# Patient Record
Sex: Female | Born: 1945 | Race: White | Hispanic: No | Marital: Married
Health system: Southern US, Community
[De-identification: ages and names within clinical notes are randomized; demographics above are authoritative.]

---

## 2003-11-01 ENCOUNTER — Other Ambulatory Visit: Payer: Self-pay

## 2005-06-08 ENCOUNTER — Emergency Department: Payer: Self-pay | Admitting: Emergency Medicine

## 2005-09-27 ENCOUNTER — Emergency Department: Payer: Self-pay | Admitting: Emergency Medicine

## 2006-07-02 ENCOUNTER — Other Ambulatory Visit: Payer: Self-pay

## 2006-07-02 ENCOUNTER — Emergency Department: Payer: Self-pay | Admitting: Emergency Medicine

## 2006-09-13 ENCOUNTER — Ambulatory Visit: Payer: Self-pay | Admitting: Gastroenterology

## 2007-02-01 ENCOUNTER — Ambulatory Visit: Payer: Self-pay | Admitting: Family Medicine

## 2007-02-10 ENCOUNTER — Inpatient Hospital Stay: Payer: Self-pay

## 2007-06-01 ENCOUNTER — Emergency Department: Payer: Self-pay | Admitting: Emergency Medicine

## 2007-06-03 ENCOUNTER — Emergency Department: Payer: Self-pay | Admitting: Emergency Medicine

## 2009-05-02 ENCOUNTER — Ambulatory Visit: Payer: Self-pay | Admitting: Family Medicine

## 2009-07-11 ENCOUNTER — Emergency Department: Payer: Self-pay | Admitting: Emergency Medicine

## 2009-08-24 ENCOUNTER — Emergency Department: Payer: Self-pay | Admitting: Emergency Medicine

## 2010-03-22 ENCOUNTER — Ambulatory Visit: Payer: Self-pay | Admitting: Internal Medicine

## 2011-01-06 ENCOUNTER — Ambulatory Visit: Payer: Self-pay | Admitting: Internal Medicine

## 2011-01-08 ENCOUNTER — Ambulatory Visit: Payer: Self-pay | Admitting: Internal Medicine

## 2011-06-11 ENCOUNTER — Emergency Department: Payer: Self-pay | Admitting: Emergency Medicine

## 2011-06-15 ENCOUNTER — Ambulatory Visit: Payer: Self-pay | Admitting: Specialist

## 2011-07-20 ENCOUNTER — Ambulatory Visit: Payer: Self-pay | Admitting: Internal Medicine

## 2011-12-31 ENCOUNTER — Ambulatory Visit: Payer: Self-pay | Admitting: Urology

## 2012-09-04 ENCOUNTER — Ambulatory Visit: Payer: Self-pay | Admitting: Orthopedic Surgery

## 2012-09-04 ENCOUNTER — Emergency Department: Payer: Self-pay | Admitting: Internal Medicine

## 2012-09-04 LAB — CBC
HCT: 36.2 % (ref 35.0–47.0)
HGB: 12.5 g/dL (ref 12.0–16.0)
MCH: 30.7 pg (ref 26.0–34.0)
MCHC: 34.5 g/dL (ref 32.0–36.0)
MCV: 89 fL (ref 80–100)
Platelet: 217 10*3/uL (ref 150–440)
RBC: 4.06 10*6/uL (ref 3.80–5.20)
RDW: 13.4 % (ref 11.5–14.5)
WBC: 4.4 10*3/uL (ref 3.6–11.0)

## 2012-09-04 LAB — BASIC METABOLIC PANEL
BUN: 12 mg/dL (ref 7–18)
Calcium, Total: 8.8 mg/dL (ref 8.5–10.1)
EGFR (African American): >60
EGFR (Non-African Amer.): >60
Glucose: 127 mg/dL — ABNORMAL HIGH (ref 65–99)
Osmolality: 283 (ref 275–301)
Sodium: 141 mmol/L (ref 136–145)

## 2012-09-04 LAB — TROPONIN I: Troponin-I: <0.02 ng/mL

## 2012-11-07 ENCOUNTER — Ambulatory Visit: Payer: Self-pay

## 2013-02-07 ENCOUNTER — Ambulatory Visit: Payer: Self-pay | Admitting: Internal Medicine

## 2013-04-08 IMAGING — CR DG HAND COMPLETE 3+V*L*
1 series · 3 of 3 positions shown · non-contrast
Comparison: none

REASON FOR EXAM: hand pain/fall
COMMENTS:

PROCEDURE:     DXR - DXR HAND LT COMPLETE  W/OBLIQUES  - September 04, 2012  [DATE]
RESULT:     Comparison:  None

[Series 1: x hand pa left · 0.14mm/px · 3 of 3 slices shown]
[im 1/3]
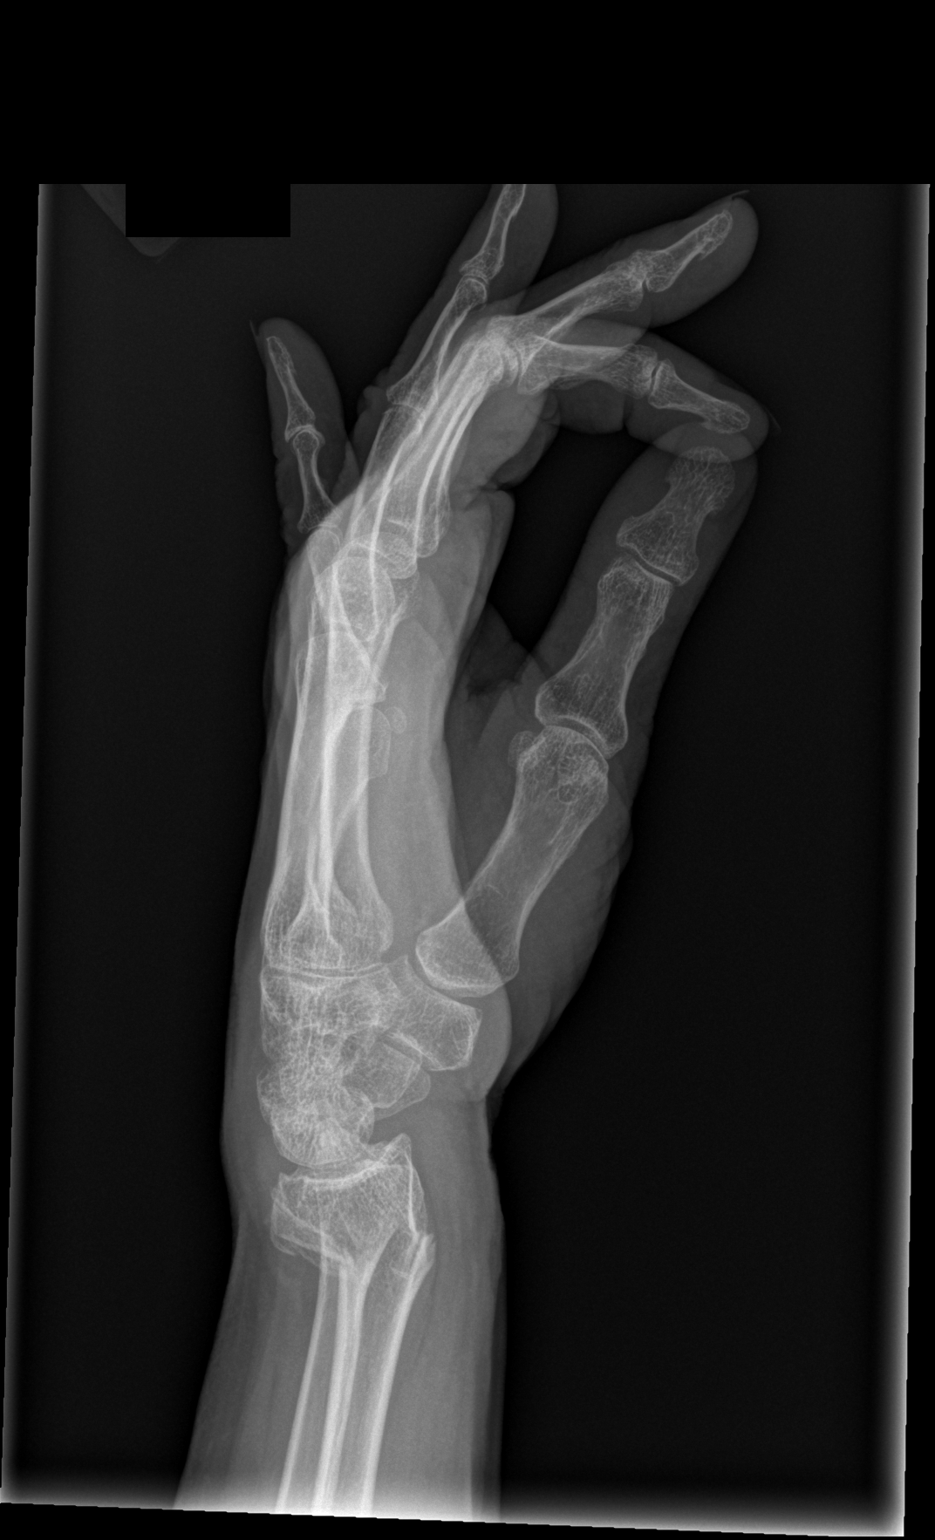
[im 2/3]
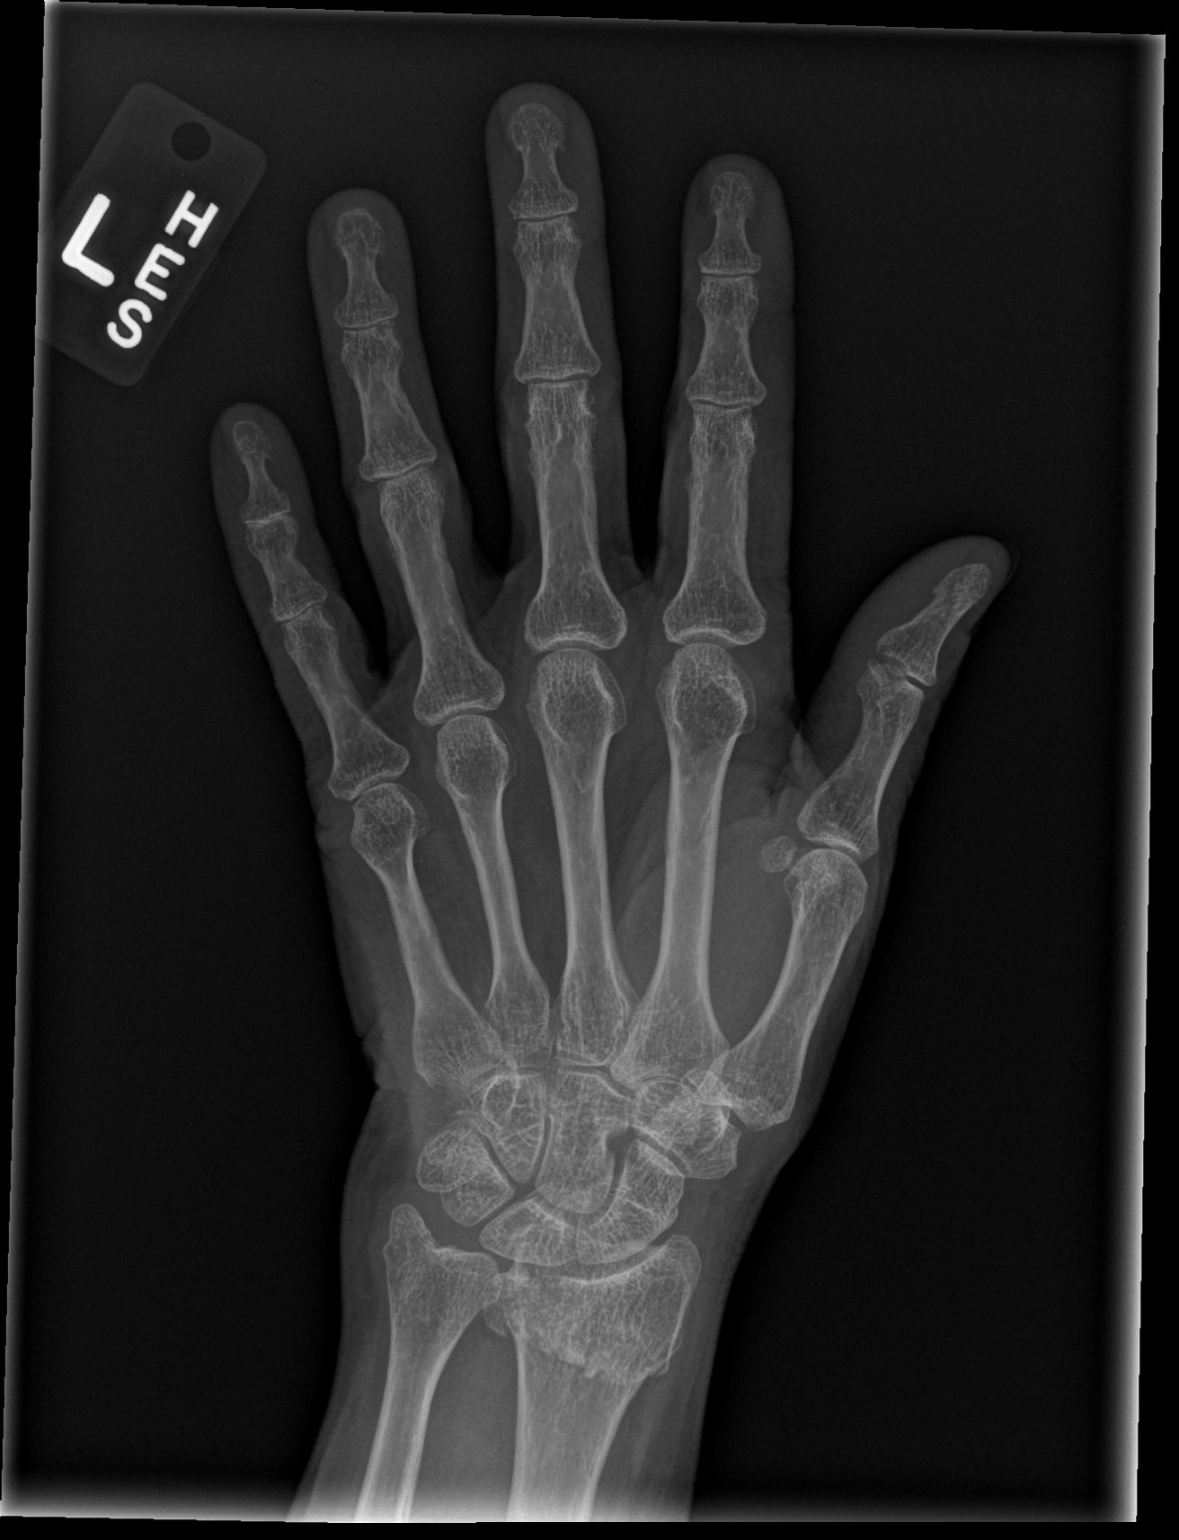
[im 3/3]
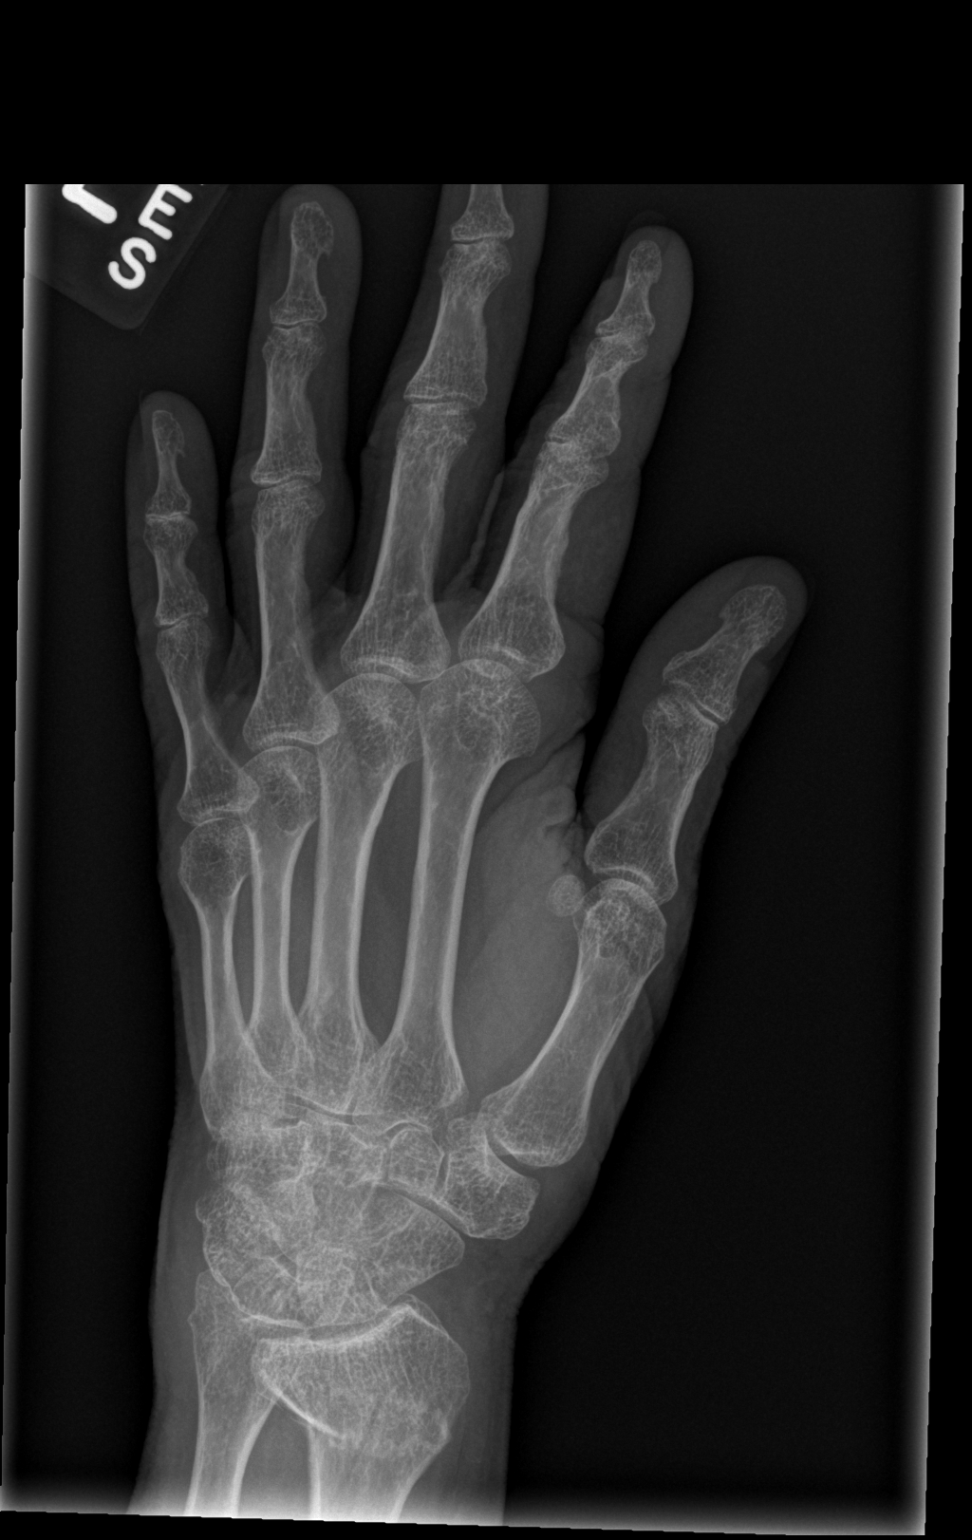

[3 of 3 positions shown; findings below may reference images not displayed]

FINDINGS: AP, oblique, and lateral views of the left hand demonstrates a mildly
comminuted fracture of the distal radial metaphysis with mild apex volar
angulation. There is severe osteopenia. There is no dislocation. There is
surrounding soft tissue swelling.
IMPRESSION: Please see above.

[REDACTED]

## 2015-03-19 NOTE — Consult Note (Signed)
PATIENT NAME:  Morgan Lamb, Morgan Lamb MR#:  [id number] DATE OF BIRTH:  Jun 19, 1946  DATE OF CONSULTATION:  09/04/2012  REFERRING PHYSICIAN:   CONSULTING PHYSICIAN:  Danelle Earthlyobin T. Eckel, MD  HISTORY OF PRESENT ILLNESS:  Ms. Morgan Lamb is a 69 year old female with a past medical history significant for insulin-dependent diabetes who sustained a fall on her left outstretched hand this morning while at church with immediate pain and deformity.  She was brought to the ER for evaluation where x-rays delineated a comminuted distal radius fracture with significant dorsal angulation.  PAST MEDICAL HISTORY:  As noted above, diabetes.  PAST SURGICAL HISTORY:  Right distal radius fracture that was treated with open reduction and internal fixation approximately one year ago.    ALLERGIES: Morphine, NSAIDs, Sulfa drugs  PHYSICAL EXAMINATION:  She has obvious deformity about her left wrist.  She has tenderness to palpation along the distal aspect of the radius.  She has no tenderness to palpation about her elbow, her forearm, or her arm.  She has painless range of motion of her elbow and her shoulder.  She is distally neurovascularly intact in median, radial, and ulnar nerve distributions and has easily palpable radial and ulnar pulses.  She has intact motor function to all of her digits of her left extremity.  RADIOGRAPHS:  Radiographs demonstrate an intraarticular comminuted distal radius fracture with a moderate amount of dorsal angulation and comminution.  ASSESSMENT:  Left distal radius fracture.  PLAN:  The patient was counseled that given the amount of comminution and angulation this likely will require operative intervention.  She was also counseled that a correction maneuver may by putting the bone in better alignment alleviate some of her pain and may give her an opportunity to heal without having to undergo surgery.  She was also counseled that this is now her second fragility fracture within the past year and she  needs to be evaluated by her primary care physician or an endocrinologist for possible osteoporosis and likely will need a DEXA scan. After obtaining consent from the patient we performed a closed reduction maneuver in the ER.  She was placed in a sugar tong splint.  Postreduction neurovascular exam was unchanged.  Postreduction radiographs will be obtained prior to her discharge from the ER.   ____________________________ Danelle Earthlyobin T. Eckel, MD tte:bjt D: 09/04/2012 14:29:10 ET T: 09/04/2012 14:34:40 ET JOB#: [id number]  cc: Danelle Earthlyobin T. Eckel, MD, <Dictator> Danelle EarthlyBIN T ECKEL MD ELECTRONICALLY SIGNED 09/04/2012 15:02
